# Patient Record
Sex: Female | Born: 1995 | Race: White | Hispanic: No | Marital: Married | Smoking: Never smoker
Health system: Southern US, Community
[De-identification: ages and names within clinical notes are randomized; demographics above are authoritative.]

## PROBLEM LIST (undated history)

## (undated) DIAGNOSIS — F909 Attention-deficit hyperactivity disorder, unspecified type: Secondary | ICD-10-CM

## (undated) DIAGNOSIS — Z8669 Personal history of other diseases of the nervous system and sense organs: Secondary | ICD-10-CM

## (undated) DIAGNOSIS — E079 Disorder of thyroid, unspecified: Secondary | ICD-10-CM

---
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
## 2021-09-21 ENCOUNTER — Other Ambulatory Visit: Payer: Self-pay

## 2021-09-21 ENCOUNTER — Emergency Department (HOSPITAL_BASED_OUTPATIENT_CLINIC_OR_DEPARTMENT_OTHER)
Admission: EM | Admit: 2021-09-21 | Discharge: 2021-09-21 | Disposition: A | Discharge status: Home / Self Care | Payer: BC Managed Care – PPO | Attending: Emergency Medicine | Admitting: Emergency Medicine

## 2021-09-21 ENCOUNTER — Encounter (HOSPITAL_BASED_OUTPATIENT_CLINIC_OR_DEPARTMENT_OTHER): Payer: Self-pay | Admitting: Emergency Medicine

## 2021-09-21 ENCOUNTER — Emergency Department (HOSPITAL_BASED_OUTPATIENT_CLINIC_OR_DEPARTMENT_OTHER): Payer: BC Managed Care – PPO

## 2021-09-21 DIAGNOSIS — M542 Cervicalgia: Secondary | ICD-10-CM | POA: Insufficient documentation

## 2021-09-21 DIAGNOSIS — G935 Compression of brain: Secondary | ICD-10-CM | POA: Insufficient documentation

## 2021-09-21 LAB — CBC WITH DIFFERENTIAL/PLATELET
Abs Immature Granulocytes: 0.01 10*3/uL (ref 0.00–0.07)
Basophils Absolute: 0 10*3/uL (ref 0.0–0.1)
Basophils Relative: 0 %
Eosinophils Absolute: 0.2 10*3/uL (ref 0.0–0.5)
Eosinophils Relative: 2 %
HCT: 37.5 % (ref 36.0–46.0)
Hemoglobin: 13 g/dL (ref 12.0–15.0)
Immature Granulocytes: 0 %
Lymphocytes Relative: 40 %
Lymphs Abs: 3.3 10*3/uL (ref 0.7–4.0)
MCH: 26.9 pg (ref 26.0–34.0)
MCHC: 34.7 g/dL (ref 30.0–36.0)
MCV: 77.5 fL — ABNORMAL LOW (ref 80.0–100.0)
Monocytes Absolute: 0.7 10*3/uL (ref 0.1–1.0)
Monocytes Relative: 8 %
Neutro Abs: 4 10*3/uL (ref 1.7–7.7)
Neutrophils Relative %: 50 %
Platelets: 314 10*3/uL (ref 150–400)
RBC: 4.84 MIL/uL (ref 3.87–5.11)
RDW: 12.9 % (ref 11.5–15.5)
WBC: 8.2 10*3/uL (ref 4.0–10.5)
nRBC: 0 % (ref 0.0–0.2)

## 2021-09-21 LAB — BASIC METABOLIC PANEL
Anion gap: 8 (ref 5–15)
BUN: 13 mg/dL (ref 6–20)
CO2: 25 mmol/L (ref 22–32)
Calcium: 9.1 mg/dL (ref 8.9–10.3)
Chloride: 101 mmol/L (ref 98–111)
Creatinine, Ser: 0.51 mg/dL (ref 0.44–1.00)
GFR, Estimated: >60 mL/min (ref >60)
Glucose, Bld: 93 mg/dL (ref 70–99)
Potassium: 3.5 mmol/L (ref 3.5–5.1)
Sodium: 134 mmol/L — ABNORMAL LOW (ref 135–145)

## 2021-09-21 LAB — PREGNANCY, URINE: Preg Test, Ur: NEGATIVE

## 2021-09-21 MED ORDER — ALUM & MAG HYDROXIDE-SIMETH 200-200-20 MG/5ML PO SUSP
30.0000 mL | Freq: Once | ORAL | Status: AC
  Administered 2021-09-21: 30 mL via ORAL
  Filled 2021-09-21: qty 30

## 2021-09-21 NOTE — ED Triage Notes (Signed)
Pt states she was recently diagnosed with hypothyroidism and is seeing a endocrinologist  Pt states today she feels like she is having difficulty swallowing and feels like her neck is swollen  Pt also states her neck feels stiff ?

## 2021-09-21 NOTE — ED Provider Notes (Signed)
?[facility] EMERGENCY DEPARTMENT ?Provider Note ? ? ?CSN: [id number] ?Arrival date & time: 09/21/21  2134 ? ?  ? ?History ? ?Chief Complaint  ?Patient presents with  ? Neck Pain  ? ? ?Monique Howard is a 26 y.o. female. ? ?The history is provided by the patient.  ?Neck Pain ?Pain location:  Generalized neck ?Pain radiates to:  Does not radiate ?Pain severity:  Moderate ?Pain is:  Same all the time ?Onset quality:  Gradual ?Duration: weeks. ?Timing:  Constant ?Progression:  Worsening ?Chronicity:  New ?Context comment:  Since being told her TSh was low she feels swelling ?Relieved by:  Nothing ?Worsened by:  Nothing ?Ineffective treatments:  None tried ?Associated symptoms: no bowel incontinence, no chest pain, no fever, no tingling, no visual change and no weakness   ?Risk factors: no hx of head and neck radiation   ?Patient who was told her TSH is low and has an appointment on 3/31 who presents with sensation or stiffness and swelling.  No f/c/r.   ?  ? ?Home Medications ?Prior to Admission medications   ?Medication Sig Start Date End Date Taking? Authorizing Provider  ?methylphenidate (DAYTRANA) 10 mg/9hr Place 1 patch onto the skin daily. wear patch for 9 hours only each day     [provider]  ?   ? ?Allergies    ?Sulfa drugs cross reactors   ? ?Review of Systems   ?Review of Systems  ?Constitutional:  Negative for fever.  ?HENT:  Negative for facial swelling, trouble swallowing and voice change.   ?Eyes:  Negative for redness.  ?Respiratory:  Negative for shortness of breath, wheezing and stridor.   ?Cardiovascular:  Negative for chest pain.  ?Gastrointestinal:  Negative for bowel incontinence and vomiting.  ?Genitourinary:  Negative for difficulty urinating.  ?Musculoskeletal:  Positive for neck pain.  ?Skin:  Negative for rash.  ?Neurological:  Negative for tingling, facial asymmetry and weakness.  ?Psychiatric/Behavioral:  Negative for agitation.   ?All other systems reviewed and are  negative. ? ?Physical Exam ?Updated Vital Signs ?BP 127/69 (BP Location: Left Arm)   Pulse (!) 125   Temp 98.2 ?F (36.8 ?C) (Oral)   Resp 18   Ht 5\' 1"  (1.549 m)   Wt 54.4 kg   LMP 09/16/2021 (Exact Date)   SpO2 98%   BMI 22.67 kg/m?  ?Physical Exam ?Vitals and nursing note reviewed. Exam conducted with a chaperone present.  ?Constitutional:   ?   General: She is not in acute distress. ?   Appearance: Normal appearance.  ?HENT:  ?   Head: Normocephalic and atraumatic.  ?   Nose: Nose normal.  ?   Mouth/Throat:  ?   Mouth: Mucous membranes are moist.  ?   Pharynx: Oropharynx is clear.  ?Eyes:  ?   Conjunctiva/sclera: Conjunctivae normal.  ?   Pupils: Pupils are equal, round, and reactive to light.  ?Neck:  ?   Vascular: No carotid bruit.  ?   Comments: No apparent swelling  ?Cardiovascular:  ?   Rate and Rhythm: Normal rate and regular rhythm.  ?   Pulses: Normal pulses.  ?   Heart sounds: Normal heart sounds.  ?Pulmonary:  ?   Effort: Pulmonary effort is normal.  ?   Breath sounds: Normal breath sounds.  ?Abdominal:  ?   General: Bowel sounds are normal.  ?   Palpations: Abdomen is soft.  ?   Tenderness: There is no abdominal tenderness.  ?Musculoskeletal:     ?  General: Normal range of motion.  ?   Cervical back: Normal range of motion and neck supple. No rigidity or tenderness.  ?Lymphadenopathy:  ?   Cervical: No cervical adenopathy.  ?Skin: ?   General: Skin is warm and dry.  ?   Capillary Refill: Capillary refill takes less than 2 seconds.  ?Neurological:  ?   General: No focal deficit present.  ?   Mental Status: She is alert and oriented to person, place, and time.  ?   Deep Tendon Reflexes: Reflexes normal.  ?Psychiatric:     ?   Mood and Affect: Mood normal.     ?   Behavior: Behavior normal.  ? ? ?ED Results / Procedures / Treatments   ?Labs ?(all labs ordered are listed, but only abnormal results are displayed) ?Results for orders placed or performed during the hospital encounter of 09/21/21   ?CBC with Differential  ?Result Value Ref Range  ? WBC 8.2 4.0 - 10.5 K/uL  ? RBC 4.84 3.87 - 5.11 MIL/uL  ? Hemoglobin 13.0 12.0 - 15.0 g/dL  ? HCT 37.5 36.0 - 46.0 %  ? MCV 77.5 (L) 80.0 - 100.0 fL  ? MCH 26.9 26.0 - 34.0 pg  ? MCHC 34.7 30.0 - 36.0 g/dL  ? RDW 12.9 11.5 - 15.5 %  ? Platelets 314 150 - 400 K/uL  ? nRBC 0.0 0.0 - 0.2 %  ? Neutrophils Relative % 50 %  ? Neutro Abs 4.0 1.7 - 7.7 K/uL  ? Lymphocytes Relative 40 %  ? Lymphs Abs 3.3 0.7 - 4.0 K/uL  ? Monocytes Relative 8 %  ? Monocytes Absolute 0.7 0.1 - 1.0 K/uL  ? Eosinophils Relative 2 %  ? Eosinophils Absolute 0.2 0.0 - 0.5 K/uL  ? Basophils Relative 0 %  ? Basophils Absolute 0.0 0.0 - 0.1 K/uL  ? Immature Granulocytes 0 %  ? Abs Immature Granulocytes 0.01 0.00 - 0.07 K/uL  ?Basic metabolic panel  ?Result Value Ref Range  ? Sodium 134 (L) 135 - 145 mmol/L  ? Potassium 3.5 3.5 - 5.1 mmol/L  ? Chloride 101 98 - 111 mmol/L  ? CO2 25 22 - 32 mmol/L  ? Glucose, Bld 93 70 - 99 mg/dL  ? BUN 13 6 - 20 mg/dL  ? Creatinine, Ser 0.51 0.44 - 1.00 mg/dL  ? Calcium 9.1 8.9 - 10.3 mg/dL  ? GFR, Estimated >60 >60 mL/min  ? Anion gap 8 5 - 15  ?Pregnancy, urine  ?Result Value Ref Range  ? Preg Test, Ur NEGATIVE NEGATIVE  ? ?CT Soft Tissue Neck Wo Contrast ? ?Result Date: 09/21/2021 ?CLINICAL DATA:  Initial evaluation for difficulty swallowing. History of hypothyroidism. EXAM: CT NECK WITHOUT CONTRAST TECHNIQUE: Multidetector CT imaging of the neck was performed following the standard protocol without intravenous contrast. RADIATION DOSE REDUCTION: This exam was performed according to the departmental dose-optimization program which includes automated exposure control, adjustment of the mA and/or kV according to patient size and/or use of iterative reconstruction technique. COMPARISON:  None. FINDINGS: Pharynx and larynx: Oral cavity within normal limits. No acute inflammatory changes seen about the dentition. Oropharynx and nasopharynx within normal limits.  Parapharyngeal fat maintained. No retropharyngeal collection or swelling. Negative epiglottis. Hypopharynx and supraglottic larynx normal. Glottis within normal limits. Subglottic airway patent clear. Salivary glands: Salivary glands including the parotid and submandibular glands are normal. Thyroid: Thyroid not well assess given lack of IV contrast. No appreciable thyroidal mass or other abnormality on this noncontrast  examination. Lymph nodes: No enlarged or pathologic adenopathy seen on this noncontrast examination. Vascular: No premature atherosclerotic change seen within the neck. 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 Otherwise unremarkable. Visualized orbits: Unremarkable. Mastoids and visualized paranasal sinuses: Paranasal sinuses are clear. Mastoid air cells and middle ear cavities are well pneumatized and free of fluid. Skeleton: No discrete or worrisome osseous lesions. Upper chest: Visualized upper chest demonstrates no 1 acute finding. Other: None. No structure abnormality seen corresponding with the palpable abnormality of concern at the right lower neck. IMPRESSION: 1. Negative CT of the neck. No acute abnormality identified. 2. 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 Electronically Signed   By: Rise Mu M.D.   On: 09/21/2021 22:43   ? ? ? ?Radiology ?CT Soft Tissue Neck Wo Contrast ? ?Result Date: 09/21/2021 ?CLINICAL DATA:  Initial evaluation for difficulty swallowing. History of hypothyroidism. EXAM: CT NECK WITHOUT CONTRAST TECHNIQUE: Multidetector CT imaging of the neck was performed following the standard protocol without intravenous contrast. RADIATION DOSE REDUCTION: This exam was performed according to the departmental dose-optimization  program which includes automated exposure control, adjustment of the mA and/or kV according to patient size and/or use of iterative reconstruction technique. COMPARISON:  None. FINDINGS: Pharynx and larynx: Oral cavity withi

## 2022-09-16 IMAGING — CT CT NECK W/O CM
4 of 5 series · 15 of 33 positions shown, 17 images · non-contrast
Comparison: None.

CLINICAL DATA: Initial evaluation for difficulty swallowing.
History of hypothyroidism.

EXAM:
CT NECK WITHOUT CONTRAST
TECHNIQUE: Multidetector CT imaging of the neck was performed following the
standard protocol without intravenous contrast.
RADIATION DOSE REDUCTION: This exam was performed according to the
departmental dose-optimization program which includes automated
exposure control, adjustment of the mA and/or kV according to
patient size and/or use of iterative reconstruction technique.

[Series 3: axial neck · axial · 0.54mm/px · z∈[+26,+174]mm · 4 of 124 slices shown, 5 images]
[im 25/124  soft-tissue]
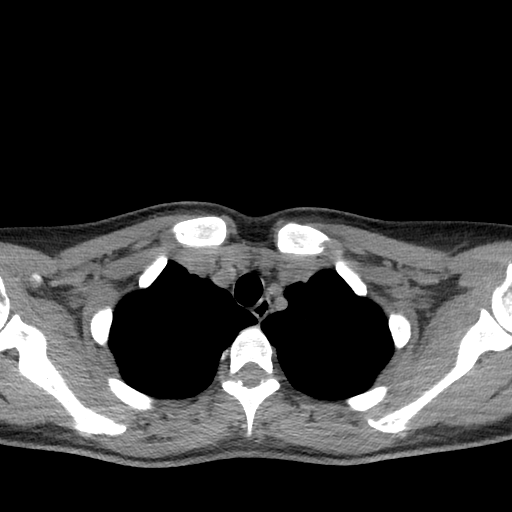
[im 25/124  bone]
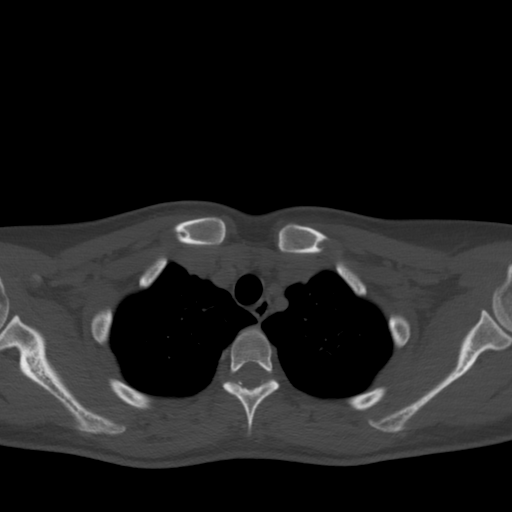
[im 50/124  bone]
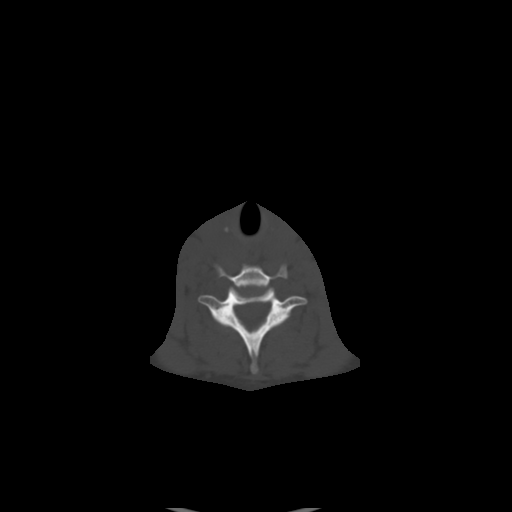
[im 74/124  bone]
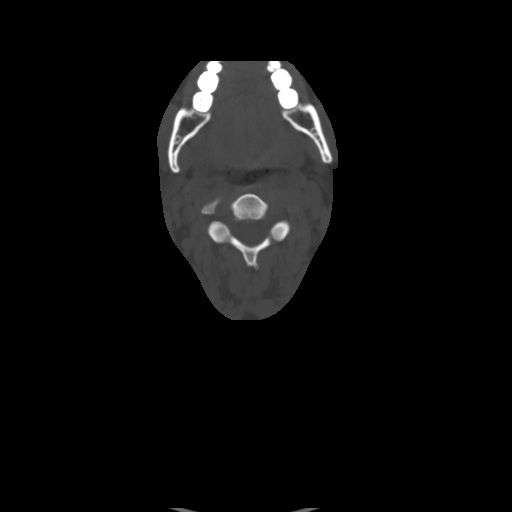
[im 99/124  bone]
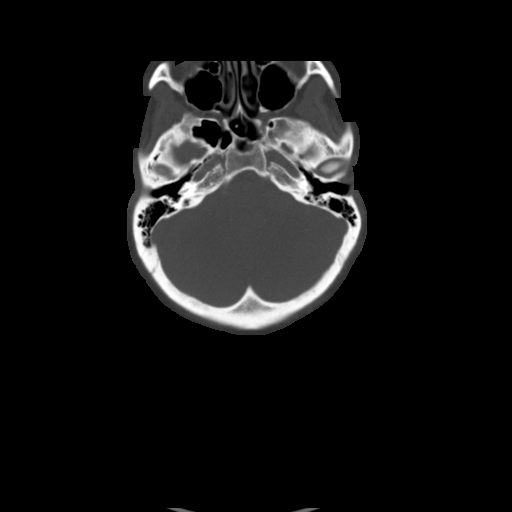

[Series 7: sag neck · sagittal · 0.48mm/px · 5 of 101 slices shown, 6 images]
[im 34/101  bone]
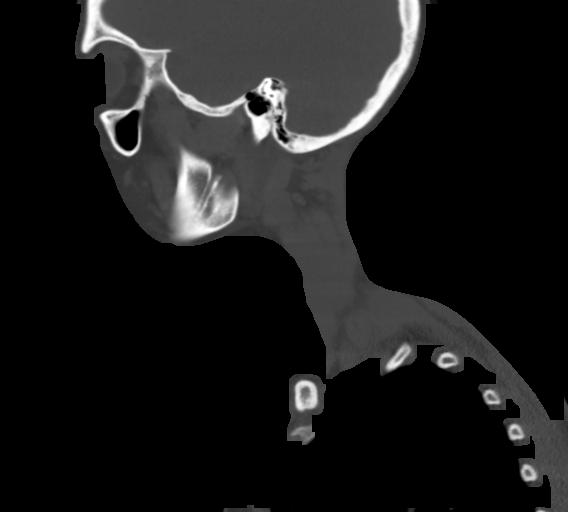
[im 42/101  bone]
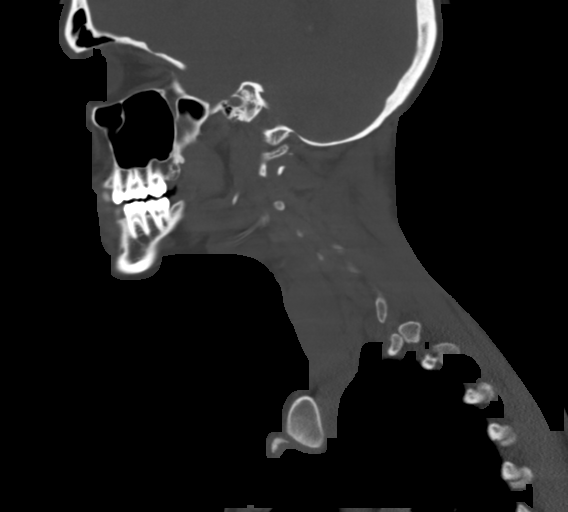
[im 51/101  soft-tissue]
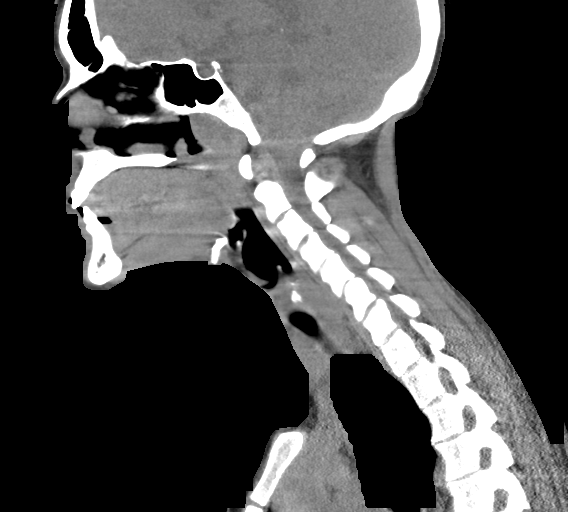
[im 51/101  bone]
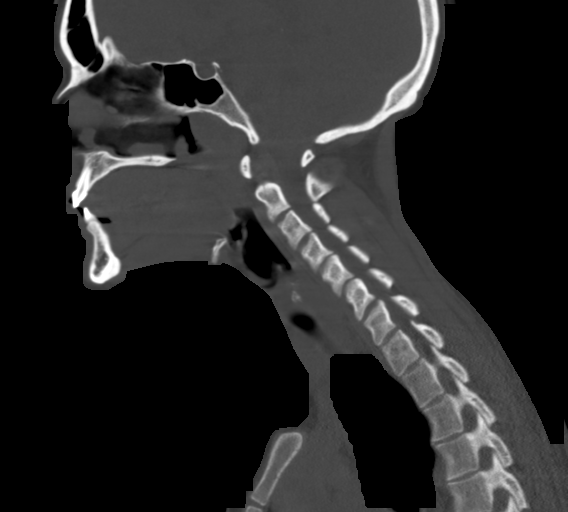
[im 59/101  bone]
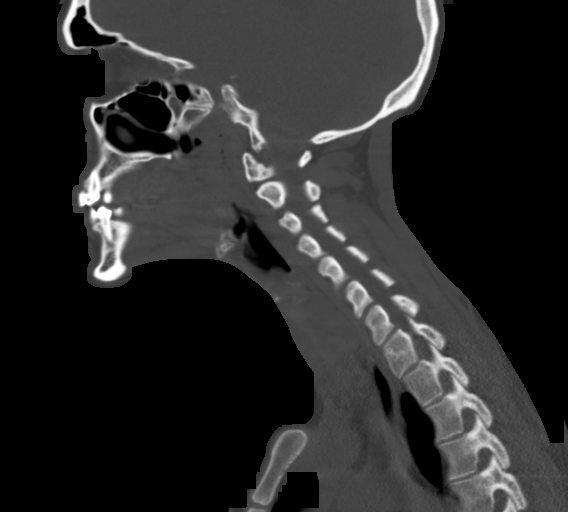
[im 67/101  bone]
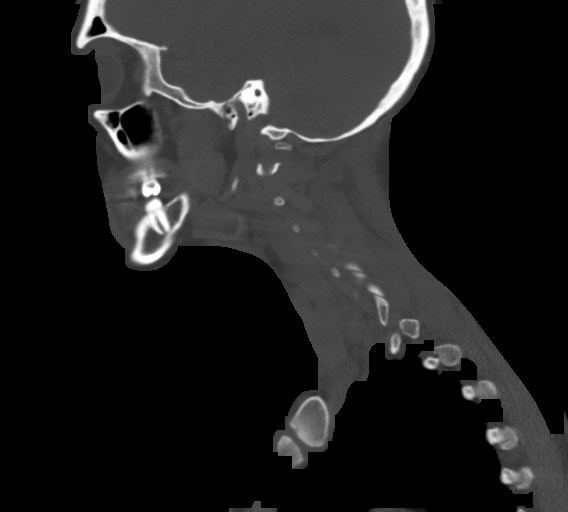

[Series 8: cor neck · coronal · 0.47mm/px · 3 of 109 slices shown]
[im 32/109  bone]
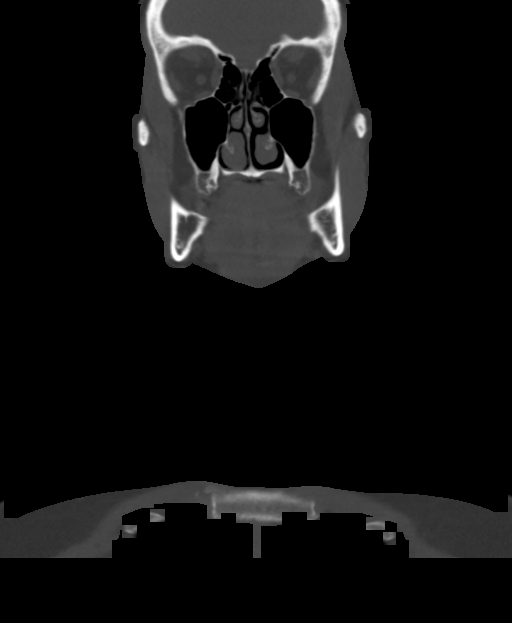
[im 47/109  bone]
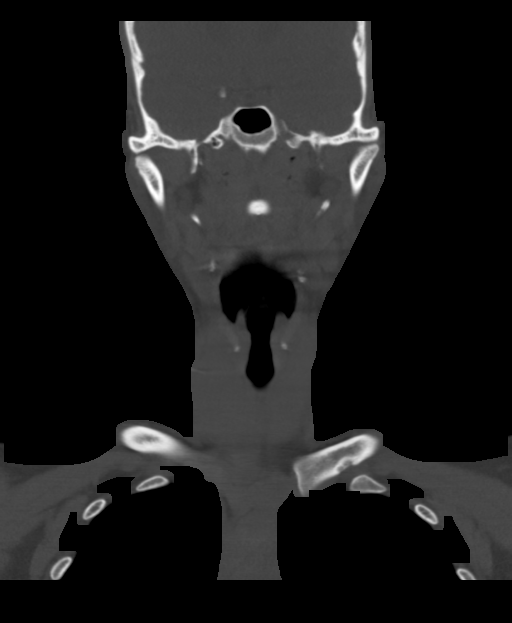
[im 62/109  bone]
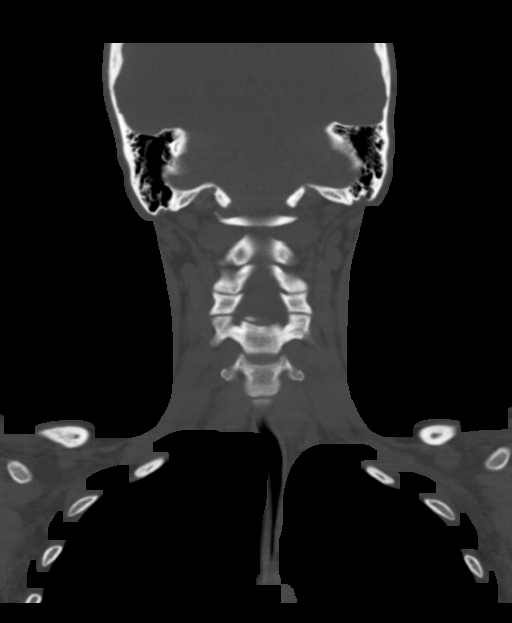

[Series 9: orthogonal ax · axial · 0.39mm/px · z∈[-20,+81]mm · 3 of 139 slices shown]
[im 28/139  bone]
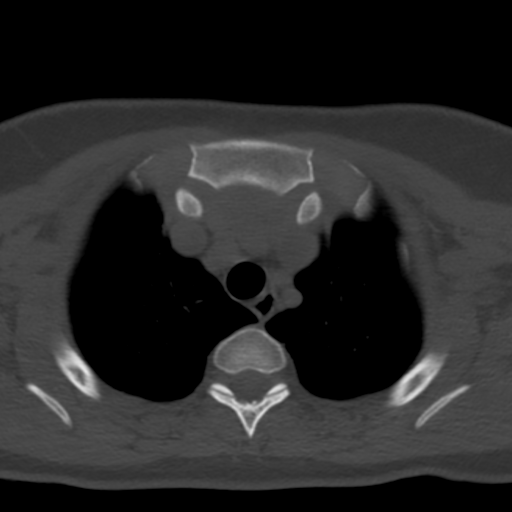
[im 56/139  bone]
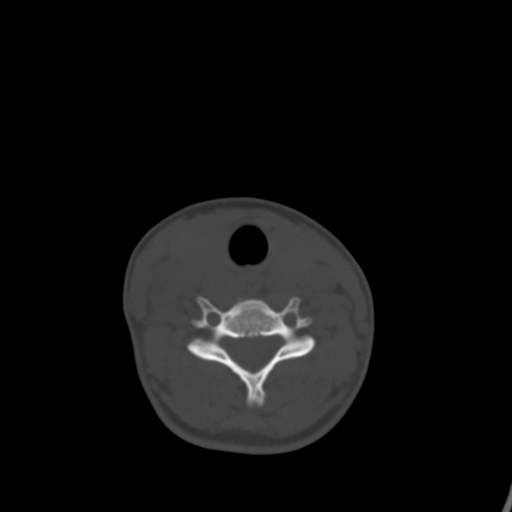
[im 83/139  bone]
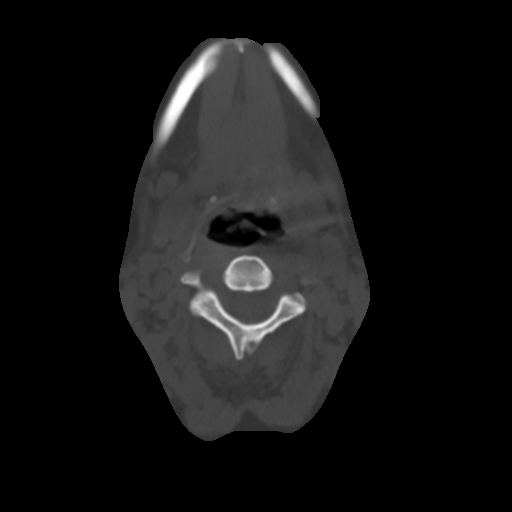

[15 of 33 positions shown; findings below may reference images not displayed]

FINDINGS: Pharynx and larynx: Oral cavity within normal limits. No acute
inflammatory changes seen about the dentition. Oropharynx and
nasopharynx within normal limits. Parapharyngeal fat maintained. No
retropharyngeal collection or swelling. Negative epiglottis.
Hypopharynx and supraglottic larynx normal. Glottis within normal
limits. Subglottic airway patent clear.

Salivary glands: Salivary glands including the parotid and
submandibular glands are normal.

Thyroid: Thyroid not well assess given lack of IV contrast. No
appreciable thyroidal mass or other abnormality on this noncontrast
examination.

Lymph nodes: No enlarged or pathologic adenopathy seen on this
noncontrast examination.

Vascular: No premature atherosclerotic change seen within the neck.

Limited intracranial: Suspected borderline Chiari 1 malformation
with the cerebellar tonsils extending up to approximate 6 mm below
the foramen magnum, not well assessed on this noncontrast
examination. Otherwise unremarkable.

Visualized orbits: Unremarkable.

Mastoids and visualized paranasal sinuses: Paranasal sinuses are
clear. Mastoid air cells and middle ear cavities are well
pneumatized and free of fluid.

Skeleton: No discrete or worrisome osseous lesions.

Upper chest: Visualized upper chest demonstrates no 1 acute finding.

Other: None. No structure abnormality seen corresponding with the
palpable abnormality of concern at the right lower neck.
IMPRESSION: 1. Negative CT of the neck. No acute abnormality identified.
2. Suspected Chiari 1 malformation with the cerebellar tonsils
extending up to approximately 6 mm below the foramen magnum.

## 2024-02-23 ENCOUNTER — Encounter (HOSPITAL_BASED_OUTPATIENT_CLINIC_OR_DEPARTMENT_OTHER): Payer: Self-pay | Admitting: Emergency Medicine

## 2024-02-23 ENCOUNTER — Emergency Department (HOSPITAL_BASED_OUTPATIENT_CLINIC_OR_DEPARTMENT_OTHER)
Admission: EM | Admit: 2024-02-23 | Discharge: 2024-02-23 | Disposition: A | Discharge status: Home / Self Care | Payer: Self-pay | Attending: Emergency Medicine | Admitting: Emergency Medicine

## 2024-02-23 ENCOUNTER — Other Ambulatory Visit: Payer: Self-pay

## 2024-02-23 ENCOUNTER — Emergency Department (HOSPITAL_BASED_OUTPATIENT_CLINIC_OR_DEPARTMENT_OTHER): Payer: Self-pay

## 2024-02-23 DIAGNOSIS — M25572 Pain in left ankle and joints of left foot: Secondary | ICD-10-CM | POA: Insufficient documentation

## 2024-02-23 DIAGNOSIS — J45909 Unspecified asthma, uncomplicated: Secondary | ICD-10-CM | POA: Insufficient documentation

## 2024-02-23 HISTORY — DX: Disorder of thyroid, unspecified: E07.9

## 2024-02-23 HISTORY — DX: Personal history of other diseases of the nervous system and sense organs: Z86.69

## 2024-02-23 MED ORDER — IBUPROFEN 400 MG PO TABS
600.0000 mg | ORAL_TABLET | Freq: Once | ORAL | Status: AC
  Administered 2024-02-23: 600 mg via ORAL
  Filled 2024-02-23: qty 1

## 2024-02-23 NOTE — ED Triage Notes (Signed)
 Pt c/o pain to LT ankle after falling down some stairs in heels tonight

## 2024-02-23 NOTE — ED Provider Notes (Signed)
[name] Provider Note   CSN: [id number] Arrival date & time: 02/23/24  8047     Patient presents with: Ankle Pain   Monique Howard is a 28 y.o. female.    Ankle Pain   28 year old female presents emergency department complaints of left ankle/foot pain.  Was walking down steps when her heels got stuck.  States that she went forward and caught herself on the railing with her upper extremities.  States that her left ankle rolled inward and has  been having pain since then.  Denies any trauma to head, LOC, blood thinner use.  Denies any abdominal pain, chest pain, back/neck pain.  States that she is mainly having pain in her left ankle/foot.  Has tried no medication for her symptoms.  Presents emergency department for further assessment.  States that she did try to bear weight on her left foot/ankle but it did cause a significant amount of pain.  States that she is currently on her menstrual cycle and is not concerned about pregnancy.  Past medical history significant for Chiari malformation, thyroid disease, ADHD, asthma  Prior to Admission medications   Medication Sig Start Date End Date Taking? Authorizing Provider  methylphenidate (DAYTRANA) 10 mg/9hr Place 1 patch onto the skin daily. wear patch for 9 hours only each day     [provider]    Allergies: Sulfa drugs cross reactors    Review of Systems  All other systems reviewed and are negative.   Updated Vital Signs BP 111/70 (BP Location: Right Arm)   Pulse 95   Temp 98.4 F (36.9 C) (Oral)   Resp 16   Ht 5' 1 (1.549 m)   Wt 54.4 kg   SpO2 100%   BMI 22.67 kg/m   Physical Exam Vitals and nursing note reviewed.  Constitutional:      General: She is not in acute distress.    Appearance: She is well-developed.  HENT:     Head: Normocephalic and atraumatic.  Eyes:     Conjunctiva/sclera: Conjunctivae normal.  Cardiovascular:     Rate and Rhythm:  Normal rate and regular rhythm.     Heart sounds: No murmur heard. Pulmonary:     Effort: Pulmonary effort is normal. No respiratory distress.     Breath sounds: Normal breath sounds. No wheezing, rhonchi or rales.  Chest:     Chest wall: No tenderness.  Abdominal:     Palpations: Abdomen is soft.     Tenderness: There is no abdominal tenderness.  Musculoskeletal:        General: No swelling.     Cervical back: Neck supple.     Comments: Pedal posterior tibial pulses 2+ bilaterally.  Patient with tenderness to squeezing of syndesmosis on the left ankle.  Tender palpation left anterior posterior lateral malleolus as well as base of fifth metatarsal.  Able to range left ankle and digits some but with pain.  No other reproducible tenderness bilateral upper or lower extremities.  No midline tenderness cervical, thoracic and lumbar spine without step-off or deformity.  No chest wall tenderness.  Skin:    General: Skin is warm and dry.     Capillary Refill: Capillary refill takes less than 2 seconds.  Neurological:     Mental Status: She is alert.  Psychiatric:        Mood and Affect: Mood normal.     (all labs ordered are listed, but only abnormal results are displayed) Labs  Reviewed - No data to display  EKG: None  Radiology: No results found.   Procedures   Medications Ordered in the ED  ibuprofen  (ADVIL ) tablet 600 mg (has no administration in time range)                                    Medical Decision Making Amount and/or Complexity of Data Reviewed Radiology: ordered.   This patient presents to the ED for concern of left ankle pain, this involves an extensive number of treatment options, and is a complaint that carries with it a high risk of complications and morbidity.  The differential diagnosis includes fracture, strain/pain, dislocation, ligament/tendinous injury, neurovascular compromise, other call placed   Co morbidities that complicate the patient  evaluation  See HPI   Additional history obtained:  Additional history obtained from EMR External records from outside source obtained and reviewed including hospital records   Lab Tests:  N/a   Imaging Studies ordered:  I ordered imaging studies including left foot/ankle x-ray I independently visualized and interpreted imaging which showed no acute osseous abnormality I agree with the radiologist interpretation   Cardiac Monitoring: / EKG:  N/a   Consultations Obtained:  N/a   Problem List / ED Course / Critical interventions / Medication management  Ankle pain I ordered medication including motrin    Reevaluation of the patient after these medicines showed that the patient improved I have reviewed the patients home medicines and have made adjustments as needed   Social Determinants of Health:  Denies tobacco, licit drug use.   Test / Admission - Considered:  Left ankle pain Vitals signs within normal range and stable throughout visit. Imaging studies significant for: See above 28 year old female presents emergency department complaints of left ankle/foot pain.  Was walking down steps when her heels got stuck.  States that she went forward and caught herself on the railing with her upper extremities.  States that her left ankle rolled inward and has  been having pain since then.  Denies any trauma to head, LOC, blood thinner use.  Denies any abdominal pain, chest pain, back/neck pain.  States that she is mainly having pain in her left ankle/foot.  Has tried no medication for her symptoms.  Presents emergency department for further assessment.  States that she did try to bear weight on her left foot/ankle but it did cause a significant amount of pain.  States that she is currently on her menstrual cycle and is not concerned about pregnancy. On exam, tender to palpation left lateral ankle, base of fifth metatarsal as above.  No pulse deficit suggest ischemic limb.   No lower extremity be concerning for DVT.  No overlying skin changes concern for secondary infectious process.  X-rays obtained of patient's foot and ankle is negative.  Suspect ankle sprain.  Will recommend symptomatic therapy, recommend follow-up with primary care/Ortho in the outpatient setting.  Treatment plan discussed with patient and she acknowledged understanding was agreeable.  Patient well-appearing, afebrile in no acute distress. Worrisome signs and symptoms were discussed with the patient, and the patient acknowledged understanding to return to the ED if noticed. Patient was stable upon discharge.       Final diagnoses:  None    ED Discharge Orders     None          Silver Wonda LABOR, [place] 02/23/24 2050    Emil Share, DO 02/23/24  2203  

## 2024-02-23 NOTE — Discharge Instructions (Addendum)
 Your x-rays did not show obvious fracture or dislocation.  Suspect ankle sprain.  Continue take Tylenol , Profen for pain, elevate your leg above the level of your heart to help with swelling.  Ice will also help with pain inflammation.  Recommend follow-up with orthopedics if you continue to have symptoms.
